# Patient Record
Sex: Male | Born: 1970 | Race: White | Hispanic: No | Marital: Married | Smoking: Former smoker
Health system: Southern US, Community
[De-identification: ages and names within clinical notes are randomized; demographics above are authoritative.]

## PROBLEM LIST (undated history)

## (undated) DIAGNOSIS — I10 Essential (primary) hypertension: Secondary | ICD-10-CM

## (undated) DIAGNOSIS — K219 Gastro-esophageal reflux disease without esophagitis: Secondary | ICD-10-CM

## (undated) DIAGNOSIS — F419 Anxiety disorder, unspecified: Secondary | ICD-10-CM

## (undated) DIAGNOSIS — R002 Palpitations: Secondary | ICD-10-CM

## (undated) DIAGNOSIS — E119 Type 2 diabetes mellitus without complications: Secondary | ICD-10-CM

## (undated) HISTORY — PX: NO PAST SURGERIES: SHX2092

## (undated) HISTORY — DX: Palpitations: R00.2

## (undated) HISTORY — DX: Anxiety disorder, unspecified: F41.9

## (undated) HISTORY — DX: Essential (primary) hypertension: I10

## (undated) HISTORY — DX: Type 2 diabetes mellitus without complications: E11.9

## (undated) HISTORY — DX: Gastro-esophageal reflux disease without esophagitis: K21.9

---

## 2005-08-04 ENCOUNTER — Ambulatory Visit: Payer: Self-pay | Admitting: Internal Medicine

## 2005-11-14 ENCOUNTER — Ambulatory Visit: Payer: Self-pay | Admitting: Internal Medicine

## 2005-11-23 ENCOUNTER — Ambulatory Visit: Payer: Self-pay | Admitting: Internal Medicine

## 2006-12-13 ENCOUNTER — Ambulatory Visit: Payer: Self-pay | Admitting: Internal Medicine

## 2006-12-13 LAB — CONVERTED CEMR LAB
Calcium: 9.4 mg/dL (ref 8.4–10.5)
HDL: 39.2 mg/dL (ref >39.0)
Potassium: 4.4 meq/L (ref 3.5–5.1)
Total CHOL/HDL Ratio: 4.3
Triglycerides: 99 mg/dL (ref 0–149)
VLDL: 20 mg/dL (ref 0–40)

## 2007-01-04 ENCOUNTER — Ambulatory Visit: Payer: Self-pay | Admitting: Internal Medicine

## 2007-01-04 DIAGNOSIS — I10 Essential (primary) hypertension: Secondary | ICD-10-CM | POA: Insufficient documentation

## 2007-01-04 LAB — CONVERTED CEMR LAB
ALT: 39 units/L (ref 0–40)
AST: 29 units/L (ref 0–37)
Alkaline Phosphatase: 63 units/L (ref 39–117)
BUN: 20 mg/dL (ref 6–23)
Basophils Relative: 0.3 % (ref 0.0–1.0)
Creatinine, Ser: 0.8 mg/dL (ref 0.4–1.5)
Eosinophils Relative: 1.4 % (ref 0.0–5.0)
Glucose, Bld: 92 mg/dL (ref 70–99)
MCHC: 34.4 g/dL (ref 30.0–36.0)
MCV: 86.5 fL (ref 78.0–100.0)
Monocytes Relative: 7.8 % (ref 3.0–11.0)
Platelets: 240 10*3/uL (ref 150–400)
Potassium: 4.4 meq/L (ref 3.5–5.1)
RBC: 4.08 M/uL — ABNORMAL LOW (ref 4.22–5.81)
RDW: 12.5 % (ref 11.5–14.6)
WBC: 9.7 10*3/uL (ref 4.5–10.5)

## 2007-01-17 ENCOUNTER — Ambulatory Visit: Payer: Self-pay | Admitting: Internal Medicine

## 2007-01-30 ENCOUNTER — Ambulatory Visit: Payer: Self-pay | Admitting: Internal Medicine

## 2007-01-30 ENCOUNTER — Encounter: Payer: Self-pay | Admitting: Internal Medicine

## 2007-01-30 ENCOUNTER — Encounter (INDEPENDENT_AMBULATORY_CARE_PROVIDER_SITE_OTHER): Payer: Self-pay | Admitting: Specialist

## 2007-01-30 LAB — CONVERTED CEMR LAB
Basophils Absolute: 0.3 10*3/uL — ABNORMAL HIGH (ref 0.0–0.1)
Eosinophils Absolute: 0.2 10*3/uL (ref 0.0–0.6)
Eosinophils Relative: 1.9 % (ref 0.0–5.0)
HCT: 38.2 % — ABNORMAL LOW (ref 39.0–52.0)
Hemoglobin: 12.8 g/dL — ABNORMAL LOW (ref 13.0–17.0)
Lymphocytes Relative: 22.9 % (ref 12.0–46.0)
MCV: 86.6 fL (ref 78.0–100.0)
Monocytes Absolute: 0.7 10*3/uL (ref 0.2–0.7)
Platelets: 236 10*3/uL (ref 150–400)
RBC: 4.41 M/uL (ref 4.22–5.81)

## 2007-04-23 ENCOUNTER — Ambulatory Visit: Payer: Self-pay | Admitting: Internal Medicine

## 2007-07-04 ENCOUNTER — Telehealth (INDEPENDENT_AMBULATORY_CARE_PROVIDER_SITE_OTHER): Payer: Self-pay | Admitting: *Deleted

## 2007-07-05 ENCOUNTER — Ambulatory Visit: Payer: Self-pay | Admitting: Internal Medicine

## 2007-07-05 DIAGNOSIS — F411 Generalized anxiety disorder: Secondary | ICD-10-CM | POA: Insufficient documentation

## 2007-07-11 ENCOUNTER — Ambulatory Visit: Payer: Self-pay | Admitting: Cardiology

## 2007-07-16 ENCOUNTER — Encounter: Payer: Self-pay | Admitting: Cardiology

## 2007-07-16 ENCOUNTER — Ambulatory Visit: Payer: Self-pay

## 2007-09-12 ENCOUNTER — Encounter (INDEPENDENT_AMBULATORY_CARE_PROVIDER_SITE_OTHER): Payer: Self-pay | Admitting: *Deleted

## 2007-09-12 ENCOUNTER — Ambulatory Visit: Payer: Self-pay | Admitting: Internal Medicine

## 2007-09-12 DIAGNOSIS — K219 Gastro-esophageal reflux disease without esophagitis: Secondary | ICD-10-CM | POA: Insufficient documentation

## 2007-09-16 ENCOUNTER — Telehealth (INDEPENDENT_AMBULATORY_CARE_PROVIDER_SITE_OTHER): Payer: Self-pay | Admitting: *Deleted

## 2007-09-17 ENCOUNTER — Ambulatory Visit: Payer: Self-pay | Admitting: Internal Medicine

## 2007-10-01 ENCOUNTER — Ambulatory Visit: Payer: Self-pay | Admitting: Internal Medicine

## 2008-11-13 ENCOUNTER — Telehealth (INDEPENDENT_AMBULATORY_CARE_PROVIDER_SITE_OTHER): Payer: Self-pay | Admitting: *Deleted

## 2008-11-25 ENCOUNTER — Encounter (INDEPENDENT_AMBULATORY_CARE_PROVIDER_SITE_OTHER): Payer: Self-pay | Admitting: *Deleted

## 2009-06-10 ENCOUNTER — Telehealth (INDEPENDENT_AMBULATORY_CARE_PROVIDER_SITE_OTHER): Payer: Self-pay | Admitting: *Deleted

## 2009-07-19 ENCOUNTER — Ambulatory Visit: Payer: Self-pay | Admitting: Internal Medicine

## 2009-07-19 ENCOUNTER — Encounter: Payer: Self-pay | Admitting: Internal Medicine

## 2009-07-19 DIAGNOSIS — L989 Disorder of the skin and subcutaneous tissue, unspecified: Secondary | ICD-10-CM | POA: Insufficient documentation

## 2009-07-21 ENCOUNTER — Ambulatory Visit: Payer: Self-pay | Admitting: Internal Medicine

## 2009-07-21 LAB — CONVERTED CEMR LAB
Basophils Absolute: 0 10*3/uL (ref 0.0–0.1)
Calcium: 8.8 mg/dL (ref 8.4–10.5)
Chloride: 106 meq/L (ref 96–112)
Eosinophils Absolute: 0.1 10*3/uL (ref 0.0–0.7)
GFR calc non Af Amer: 88.52 mL/min (ref >60)
Glucose, Bld: 123 mg/dL — ABNORMAL HIGH (ref 70–99)
HDL: 30.3 mg/dL — ABNORMAL LOW (ref >39.00)
Hemoglobin: 15.4 g/dL (ref 13.0–17.0)
Lymphocytes Relative: 24.1 % (ref 12.0–46.0)
MCHC: 33.3 g/dL (ref 30.0–36.0)
Monocytes Absolute: 0.7 10*3/uL (ref 0.1–1.0)
Neutro Abs: 5.3 10*3/uL (ref 1.4–7.7)
Potassium: 4.3 meq/L (ref 3.5–5.1)
RBC: 5.28 M/uL (ref 4.22–5.81)
RDW: 13.5 % (ref 11.5–14.6)
WBC: 8.1 10*3/uL (ref 4.5–10.5)

## 2009-07-28 ENCOUNTER — Telehealth (INDEPENDENT_AMBULATORY_CARE_PROVIDER_SITE_OTHER): Payer: Self-pay | Admitting: *Deleted

## 2009-07-28 DIAGNOSIS — E119 Type 2 diabetes mellitus without complications: Secondary | ICD-10-CM | POA: Insufficient documentation

## 2009-11-18 ENCOUNTER — Encounter: Admission: RE | Admit: 2009-11-18 | Discharge: 2009-11-18 | Payer: Self-pay

## 2010-06-07 ENCOUNTER — Telehealth (INDEPENDENT_AMBULATORY_CARE_PROVIDER_SITE_OTHER): Payer: Self-pay | Admitting: *Deleted

## 2010-07-28 ENCOUNTER — Ambulatory Visit: Payer: Self-pay | Admitting: Internal Medicine

## 2010-08-01 ENCOUNTER — Ambulatory Visit: Payer: Self-pay | Admitting: Internal Medicine

## 2010-08-03 LAB — CONVERTED CEMR LAB
ALT: 33 units/L (ref 0–53)
BUN: 27 mg/dL — ABNORMAL HIGH (ref 6–23)
Basophils Absolute: 0 10*3/uL (ref 0.0–0.1)
CO2: 27 meq/L (ref 19–32)
Chloride: 103 meq/L (ref 96–112)
Creatinine, Ser: 1 mg/dL (ref 0.4–1.5)
GFR calc non Af Amer: 87.04 mL/min (ref >60)
Glucose, Bld: 106 mg/dL — ABNORMAL HIGH (ref 70–99)
HDL: 35.5 mg/dL — ABNORMAL LOW (ref >39.00)
Lymphocytes Relative: 25.1 % (ref 12.0–46.0)
MCHC: 33.4 g/dL (ref 30.0–36.0)
MCV: 88.6 fL (ref 78.0–100.0)
Monocytes Absolute: 0.8 10*3/uL (ref 0.1–1.0)
Neutro Abs: 4.6 10*3/uL (ref 1.4–7.7)
Platelets: 209 10*3/uL (ref 150.0–400.0)
RDW: 14 % (ref 11.5–14.6)
TSH: 0.73 microintl units/mL (ref 0.35–5.50)
VLDL: 8.6 mg/dL (ref 0.0–40.0)

## 2010-08-23 ENCOUNTER — Ambulatory Visit: Payer: Self-pay | Admitting: Internal Medicine

## 2010-08-23 DIAGNOSIS — G5600 Carpal tunnel syndrome, unspecified upper limb: Secondary | ICD-10-CM | POA: Insufficient documentation

## 2010-09-26 ENCOUNTER — Telehealth (INDEPENDENT_AMBULATORY_CARE_PROVIDER_SITE_OTHER): Payer: Self-pay | Admitting: *Deleted

## 2010-11-27 LAB — CONVERTED CEMR LAB
CO2: 31 meq/L (ref 19–32)
Creatinine, Ser: 1 mg/dL (ref 0.4–1.5)
Eosinophils Absolute: 0.1 10*3/uL (ref 0.0–0.6)
Eosinophils Relative: 1.6 % (ref 0.0–5.0)
Glucose, Bld: 96 mg/dL (ref 70–99)
Hemoglobin: 14.6 g/dL (ref 13.0–17.0)
Lymphocytes Relative: 25.4 % (ref 12.0–46.0)
MCHC: 33.2 g/dL (ref 30.0–36.0)
Monocytes Relative: 8.4 % (ref 3.0–11.0)
Neutrophils Relative %: 64.4 % (ref 43.0–77.0)
Potassium: 4.4 meq/L (ref 3.5–5.1)
RBC: 5.38 M/uL (ref 4.22–5.81)
Sodium: 136 meq/L (ref 135–145)
WBC: 8.4 10*3/uL (ref 4.5–10.5)

## 2010-11-30 ENCOUNTER — Ambulatory Visit: Payer: Self-pay | Admitting: Internal Medicine

## 2010-11-30 ENCOUNTER — Ambulatory Visit: Admit: 2010-11-30 | Payer: Self-pay | Admitting: Internal Medicine

## 2010-12-01 NOTE — Assessment & Plan Note (Signed)
Summary: cpx & lab/cbs   Vital Signs:  Patient profile:   40 year old male Height:      70 inches Weight:      263 pounds BMI:     37.87 Pulse rate:   76 / minute Pulse rhythm:   regular BP sitting:   124 / 82  (left arm) Cuff size:   large  Vitals Entered By: Army Fossa CMA (July 28, 2010 3:04 PM) CC: CPX, not fasting Comments declines flu shot [facility] [address]    History of Present Illness: CPX   Preventive Screening-Counseling & Management  Alcohol-Tobacco     Smoking Status: current  Caffeine-Diet-Exercise     Does Patient Exercise: yes     Times/week: 3  Current Medications (verified): 1)  Atenolol 100 Mg  Tabs (Atenolol) .Marland Kitchen.. 1 By Mouth Two Times A Day 2)  Nexium 40 Mg Cpdr (Esomeprazole Magnesium) .... Take 1 Capsule By Mouth Once A Day  Allergies (verified): No Known Drug Allergies  Past History:  Past Medical History: Reviewed history from 07/19/2009 and no changes required. Hypertension Anxiety GERD Palpaitations 07-2007: saw cards, ECHO normal  DUODENITIS W/HEMORRHAGE -- EGD was positive,H. pylori positive, status post treatment.  Family History: coronary artery disease--no  prostate cancer--no colon cancer--no son has IHSS, resolving as he is growing  diabetes-- uncle   grandmother has CHF. 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 CV:  Denies chest pain or discomfort and swelling of feet. Resp:  Denies cough and shortness of breath. GI:  Denies bloody stools, nausea, and vomiting. GU:  Denies dysuria, urinary frequency, and urinary hesitancy. MS:  knee pain, R>L . Psych:  admits to  anxiety related to  work load, also separated from his wife.  Physical Exam  General:  alert, well-developed, and overweight-appearing.   Neck:  no masses and no thyromegaly.   Lungs:  normal respiratory effort, no intercostal retractions, no accessory muscle use, and normal breath sounds.   Heart:  normal rate, regular rhythm, no murmur, and no gallop.   Abdomen:  soft, non-tender, no distention, no masses, no guarding, and no rigidity.   Extremities:  no edema   Impression & Recommendations:  Problem # 1:  HEALTH SCREENING (ICD-V70.0) Td 2008 reluctant to take the flu shot, benefits discussed, will call if he changes  his mind labs diet- exercise -- discussed   Problem # 2:  DM (ICD-250.00) diabetes diagnosed last year based on A1c of 6.7 We discussed the meaning of A1c We'll recheck it Depending on the results, he may need medication. Come back in 4 months refer to a nutritionist  Orders: Nutrition Referral (Nutrition)  Problem # 3:  ANXIETY (ICD-300.00) counseled Does not feel that he needs medication or psychotherapy but will call if the need arise  Problem # 4:  HYPERTENSION (ICD-401.9) self decreased atenolol to 1 once daily b/c he was fatigue, doing well now, good ambulatory BPs  plan: stay on 1 once daily  His updated medication list for this problem includes:    Atenolol 100 Mg Tabs (Atenolol) .Marland Kitchen... 1 by mouth once daily  BP today: 124/82 Prior BP: 120/80 (07/19/2009)  Labs  Reviewed: K+: 4.3 (07/21/2009) Creat: : 1.0 (07/21/2009)   Chol: 169 (07/21/2009)   HDL: 30.30 (07/21/2009)   LDL: 116 (07/21/2009)   TG: 116.0 (07/21/2009)  Complete Medication List: 1)  Atenolol 100 Mg Tabs (Atenolol) .Marland Kitchen.. 1 by mouth once daily 2)  Nexium 40 Mg Cpdr (Esomeprazole magnesium) .... Take 1 capsule by mouth once a day  Patient Instructions: 1)  come back fasting 2)  FLP, BMP, CBC, TSH, AST, ALT--- dx v70 3)  Hemoglobin A1c---dx diabetes 4)  Please schedule a follow-up appointment in 4  months .    Risk Factors:  Tobacco use:  current    Cigars:  Yes -- 1-2 per week Exercise:  yes    Times per week:  3

## 2010-12-01 NOTE — Progress Notes (Signed)
Summary: Refill Request  Phone Note Refill Request Message from:  Patient on September 26, 2010 1:06 PM  Refills Requested: Medication #1:  NEXIUM 40 MG CPDR Take 1 capsule by mouth once a day.   Dosage confirmed as above?Dosage Confirmed   Supply Requested: 3 months   [facility] [address] [facility] [place]  Next Appointment Scheduled: 2.1.12 Initial call taken by: Harold Barban,  September 26, 2010 1:06 PM    Prescriptions: NEXIUM 40 MG CPDR (ESOMEPRAZOLE MAGNESIUM) Take 1 capsule by mouth once a day  #30 Each x 3   Entered by:   Army Fossa CMA   Authorized by:   Nolon Rod. Paz MD   Signed by:   Army Fossa CMA on 09/26/2010   Method used:   [facility]ronically to        [id number]       [address]       [place], [place]  [address]       Ph: [phone]       Fax: [phone]   RxID:   [id number]

## 2010-12-01 NOTE — Progress Notes (Signed)
Summary: cpx scheduled [phone]  Phone Note Call from Patient Call back at Home Phone [phone]   Caller: Spouse Summary of Call: NEEDS REFILL FOR NEXIUM CALLED INTO  [facility] [facility] Initial call taken by: Jerolyn Shin,  June 07, 2010 9:37 AM  Follow-up for Phone Call        Pt has not been seen since 07/21/09. okay to fill? Army Fossa CMA  June 07, 2010 10:11 AM okay to rf, he will be due for her next visit 06-2010 [facility] E. Paz MD  June 07, 2010 3:48 PM   Additional Follow-up for Phone Call Additional follow up Details #1::        Needs to schedule appt for 06/2010. Army Fossa CMA  June 07, 2010 3:50 PM cpx scheduled [id number] .Marland KitchenOkey Regal [place]  June 08, 2010 11:31 AM     Prescriptions: NEXIUM 40 MG CPDR (ESOMEPRAZOLE MAGNESIUM) Take 1 capsule by mouth once a day  #30 x 0   Entered by:   Army Fossa CMA   Authorized by:   Nolon Rod. Paz MD   Signed by:   Army Fossa CMA on 06/07/2010   Method used:   Electronically to        [facility]  [phone]* (retail)       [address]       [place], [place]  [address]       Ph: [phone]       Fax: [phone]   RxID:   [id number]

## 2010-12-01 NOTE — Assessment & Plan Note (Signed)
Summary: INFLAMATION ON HIS HAND   Vital Signs:  Patient profile:   40 year old male Weight:      257 pounds Pulse rate:   60 / minute Pulse rhythm:   regular BP sitting:   134 / 84  (left arm) Cuff size:   large  Vitals Entered By: Army Fossa CMA (August 23, 2010 11:46 AM) CC: Pt here c/o hands going numb, unable to make a fist in the am. Comments going on for 3-4 years, getting worse overtime pharm[facility] [address]    History of Present Illness: long history of on and off tingling in the hands, right worse than left,  usually  when he rides a motorcycle or when he does a lot of remodeling and  uses  his hands. In the last few months, he has been very active and he has severe pain and tingling R>L hand , unable to use his right hand sometimes  Review of systems Mild neck pain without radiation, no rash anywhere in the neck or arm.   Current Medications (verified): 1)  Atenolol 100 Mg  Tabs (Atenolol) .Marland Kitchen.. 1 By Mouth Once Daily 2)  Nexium 40 Mg Cpdr (Esomeprazole Magnesium) .... Take 1 Capsule By Mouth Once A Day  Allergies (verified): No Known Drug Allergies  Past History:  Past Medical History: AODM Hypertension Anxiety GERD Palpaitations 07-2007: saw cards, ECHO normal  DUODENITIS W/HEMORRHAGE -- EGD was positive,H. pylori positive, status post treatment.  Past Surgical History: Reviewed history from 07/19/2009 and no changes required. Denies surgical history  Social History: Reviewed history from 07/28/2010 and no changes required. 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   Neck:  full range of motion, nontender to palpation Extremities:  right wrist slightly tender to touch, putting pressure on the right wrist cause some  tingling in the first second and third finger. Left wrist essentially within normal Fingers without redness or swelling.   Impression & Recommendations:  Problem # 1:  CARPAL TUNNEL SYNDROME, BILATERAL (ICD-354.0)  history consistent with carpal tunnel syndrome, right worse than left We discussed conservative therapy including a splint which she already has and use from time to time.  At this point he needs a more permanent solution, will refer to orthopedic surgery  Orders: Orthopedic Referral (Ortho)  Complete Medication List: 1)  Atenolol 100 Mg Tabs (Atenolol) .Marland Kitchen.. 1 by mouth once daily 2)  Nexium 40 Mg Cpdr (Esomeprazole magnesium) .... Take 1 capsule by mouth once a day   Orders Added: 1)  Orthopedic Referral [Ortho] 2)  Est. Patient Level III [[address]]

## 2010-12-07 ENCOUNTER — Telehealth (INDEPENDENT_AMBULATORY_CARE_PROVIDER_SITE_OTHER): Payer: Self-pay | Admitting: *Deleted

## 2010-12-15 NOTE — Progress Notes (Signed)
Summary: rx  Phone Note Refill Request Call back at Home Phone [phone] Message from:  Patient on December 07, 2010 9:37 AM  Refills Requested: Medication #1:  ATENOLOL 100 MG  TABS 1 by mouth once daily   Dosage confirmed as above?Dosage Confirmed   [facility]ed[facility] [address]  Initial call taken by: Freddy Jaksch,  December 07, 2010 9:37 AM    New/Updated Medications: ATENOLOL 100 MG  TABS (ATENOLOL) 1 by mouth once daily. DUE FOR OFFICE VISIT. Prescriptions: ATENOLOL 100 MG  TABS (ATENOLOL) 1 by mouth once daily. DUE FOR OFFICE VISIT.  #30 x 0   Entered by:   Army Fossa CMA   Authorized by:   Nolon Rod. Paz MD   Signed by:   Army Fossa CMA on 12/07/2010   Method used:   [facility]ronically to        [id number]       [address]       [place], [place]  [address]       Ph: [phone]       Fax: [phone]   RxID:   [id number]

## 2011-02-07 ENCOUNTER — Other Ambulatory Visit: Payer: Self-pay | Admitting: Internal Medicine

## 2011-03-10 ENCOUNTER — Other Ambulatory Visit: Payer: Self-pay | Admitting: Internal Medicine

## 2011-03-14 NOTE — Assessment & Plan Note (Signed)
[facility]                            CARDIOLOGY OFFICE NOTE   JAIMIE, REDDITT                       MRN:          [id number]  DATE:07/11/2007                            DOB:          1971/09/09    HISTORY:  Mr. Sackrider is a 40 year old gentleman with no prior cardiac  history who we are asked to evaluate for palpitations.   The patient typically does not have dyspnea on exertion, orthopnea,  paroxysmal nocturnal dyspnea, pedal edema, presyncope, syncope or  exertional chest pain.  He does state that he felt sore after lifting  weights, in his chest.  Approximately two years ago he developed  palpitations.  They are described as a hard beat.  They are not  sustained palpitations nor is there associated presyncope, syncope,  chest pain or shortness of breath.  He decreased his caffeine intake and  these improved.  However, recently, he has been diagnosed with H. Pylori  and treated.  He came off the Nexium and he feels that his symptoms now  have returned similar to the description above.  Also, he is under a  significant amount of stress at work by his report with increased  responsibilities.  Because of his palpitations, we were asked to further  evaluate.   MEDICATIONS:  His medications include:  1. Atenolol 150 mg p.o. daily.  2. Nexium 40 mg p.o. daily.   ALLERGIES:  He has no known drug allergies.   SOCIAL HISTORY:  He smokes approximately 1 cigar per month.  He consumes  approximately 5 beers per month.  There is no drug use.  He does consume  several caffeinated beverages per day.   FAMILY HISTORY:  Significant for hypertrophic obstructive cardiomyopathy  in his son.  He apparently has been evaluated for this and a previous  echocardiogram when his son was a child was unremarkable.   PAST MEDICAL HISTORY:  Significant for hypertension but there is no  diabetes mellitus or hyperlipidemia.  He has had previous fractures of  both of  his hands while boxing. He has also had a previous fractured  clavicle.  He has a recent H. Pylori infection and does have reflux.   REVIEW OF SYSTEMS:  There are no headaches, fevers or chills.  There is  no productive cough or hemoptysis.  There is no dysphagia, odynophagia,  melena, hematochezia.  There is no dysuria or hematuria. There is no  rash or seizure activity.  There is no orthopnea, PND, pedal edema.  There is no claudication.  The remainder of his review of systems are  negative.   PHYSICAL EXAMINATION:  VITAL SIGNS:  His physical examination today  shows a blood pressure of 152/97. His pulse is 76. He weighs 259 pounds.  GENERAL APPEARANCE:  He is well-developed and somewhat obese.  He is in  no acute distress.  He does not appear to be depressed.  SKIN:  His skin is warm and dry.  There is no peripheral clubbing.  MUSCULOSKELETAL:  His back  is normal.  HEENT:  Normal with normal eyelids.  NECK:  Supple with normal upstrokes bilaterally.  No bruits.  There is  no jugular venous distention and no thyromegaly is noted.  CHEST:  Clear to auscultation with normal expansion.  CARDIOVASCULAR:  His cardiovascular exam reveals a regular rate and  rhythm with normal S1 and S2.  There are no murmurs, rubs, gallops  noted.  His PMI is nondisplaced.  ABDOMEN:  No tenderness.  Positive bowel sounds.  No hepatosplenomegaly.  No masses appreciated.  There is no abdominal bruit.  He has 2+ femoral  pulses bilaterally.  No bruits.  EXTREMITIES:  No edema and I could palpate no cords.  He has 2+  posterior tibial pulses bilaterally.  NEUROLOGICAL:  His exam is grossly intact.   LABORATORY DATA:  An electrocardiogram today shows a sinus rhythm at a  rate of 79.  The axis is normal.  There are no significant ST changes.   DIAGNOSES:  1. Palpitations.  Mr. Carpenito description of his symptoms are most      consistent with premature atrial or ventricular contractions.  We      will  schedule him to have an echocardiogram to quantify his left      ventricular function.  I have also asked him to increase his      atenolol and we will change to 100 mg p.o. b.i.d.  I have also      instructed him to decrease his caffeine use.  I will see him back      in 8 weeks and if he continues to have symptoms we could consider      an event monitor to more fully evaluate.  2. Hypertension.  His blood pressure is elevated today and I have      asked him to change his Atenolol to 100 mg p.o. b.i.d.  We will      track this and add additional medicines if indicated.  3. Gastroesophageal reflux disease.  He will continue on his Nexium.     Madolyn Frieze Jens Som, MD, [facility]  Electronically Signed    BSC/MedQ  DD: 07/11/2007  DT: 07/12/2007  Job #: [id number]   cc:   Willow Ora, MD

## 2011-03-17 NOTE — Assessment & Plan Note (Signed)
[facility]                         GASTROENTEROLOGY OFFICE NOTE   Gabriel Patel, Gabriel Patel                       MRN:          [id number]  DATE:01/17/2007                            DOB:          Apr 30, 1971    REFERRING PHYSICIAN:  Willow Ora, MD   CHIEF COMPLAINT:  Melena.   ASSESSMENT:  A 40 year old white man who had several days of melena  earlier in the month.  He had a Hemoccult positive brown stool on Dr.  Leta Jungling exam of January 04, 2007.  Subsequently, he has been on Nexium and  this has resolved.  There are no obvious risk factors for his melena,  which included review for NSAID.  No Pepto-Bismol.  He was taking a Fat-  Burner supplement, but I do not think that would be related.  There was  some bloating along with that week of black tarry stools.   RECOMMENDATIONS AND PLAN:  1. Schedule upper GI endoscopy.  2. I think he can reduce his Nexium to a once a day dose at this time.  3. He was mildly anemic with a hemoglobin of 12.  We will recheck a      CBC.  4. If he has an unrevealing upper endoscopy, I think he is going to      need a colonoscopy to exclude bleeding from that area, versus      watchful waiting, but we have discussed this and it sounds like he      is in agreement of a plan of colonoscopy if the upper endoscopy is      unrevealing.   HISTORY:  As above.  See medical history form.   PAST MEDICAL HISTORY:  Hypertension.   MEDICATIONS:  1. Atenolol 150 mg daily.  2. Nexium 40 mg b.i.d.   There are no known drug allergies.   FAMILY HISTORY:  A grandmother had colon cancer.   SOCIAL HISTORY:  He manages a Agricultural consultant.  He is married.  He has 1 son.  He has a Naval architect.  Social alcohol use.  No  tobacco or drugs.  When he drinks too much coffee he gets palpitations  apparently.   Other review of systems negative.   PHYSICAL EXAM:  Reveals a well-developed, well-nourished young white  man.  Height 5  feet 11 inches, weight 239 pounds, blood pressure 118/82, pulse  64 and regular.  EYES:  Anicteric.  MOUTH:  Free of lesions.  NECK:  Supple.  CHEST:  Clear.  HEART:  S1, S2.  No rubs, murmurs, or gallops.  ABDOMEN:  Soft.  Nontender.  Without organomegaly or mass.  LOWER EXTREMITIES:  Free of edema.  He is alert and nontoxic.   I appreciate the opportunity to care for this patient.  I have reviewed  the labs and office notes from Dr. Drue Novel.     Iva Boop, MD,FACG  Electronically Signed    CEG/MedQ  DD: 01/18/2007  DT: 01/18/2007  Job #: [id number]

## 2011-04-11 ENCOUNTER — Other Ambulatory Visit: Payer: Self-pay | Admitting: Internal Medicine

## 2011-04-11 NOTE — Telephone Encounter (Signed)
Pt is due for an appt- please schedule.

## 2011-04-11 NOTE — Telephone Encounter (Signed)
Called patient to make appointment---he says he cant make appointment until he gets more insurance--advised him that Atenalol presc was called in for 30 days--he asked Dr Drue Novel to "understand" until he can get back on insurance

## 2011-05-10 ENCOUNTER — Other Ambulatory Visit: Payer: Self-pay | Admitting: Internal Medicine

## 2011-06-08 ENCOUNTER — Other Ambulatory Visit: Payer: Self-pay | Admitting: Internal Medicine

## 2011-07-13 ENCOUNTER — Other Ambulatory Visit: Payer: Self-pay | Admitting: Internal Medicine

## 2011-07-13 NOTE — Telephone Encounter (Signed)
Rx Done for 1-mth supply; OV required for further refills.

## 2011-08-10 ENCOUNTER — Telehealth: Payer: Self-pay | Admitting: Internal Medicine

## 2011-08-10 NOTE — Telephone Encounter (Signed)
Print a copy of his complete physical exam from 07/28/2010 that is essentially a statement of  his medical condition. Marland Kitchen He not only has hypertension but also diabetes. he is actually overdue for a routine visit !

## 2011-08-11 ENCOUNTER — Telehealth: Payer: Self-pay | Admitting: Internal Medicine

## 2011-08-11 NOTE — Telephone Encounter (Signed)
Patient Summary & last CPE [06/2010] printed from [facility]. Faxed to patient per his request to [phone].

## 2011-08-15 NOTE — Telephone Encounter (Signed)
error 

## 2011-08-21 ENCOUNTER — Other Ambulatory Visit: Payer: Self-pay | Admitting: Internal Medicine

## 2011-08-21 MED ORDER — ATENOLOL 100 MG PO TABS: 100.0000 mg | ORAL_TABLET | Freq: Every day | ORAL | Status: DC

## 2011-08-21 NOTE — Telephone Encounter (Signed)
Done

## 2011-08-31 ENCOUNTER — Encounter: Payer: Self-pay | Admitting: Internal Medicine

## 2011-09-01 ENCOUNTER — Ambulatory Visit (INDEPENDENT_AMBULATORY_CARE_PROVIDER_SITE_OTHER): Payer: PRIVATE HEALTH INSURANCE | Admitting: Internal Medicine

## 2011-09-01 ENCOUNTER — Encounter: Payer: Self-pay | Admitting: Internal Medicine

## 2011-09-01 DIAGNOSIS — I10 Essential (primary) hypertension: Secondary | ICD-10-CM

## 2011-09-01 DIAGNOSIS — E119 Type 2 diabetes mellitus without complications: Secondary | ICD-10-CM

## 2011-09-01 LAB — BASIC METABOLIC PANEL
BUN: 18 mg/dL (ref 6–23)
Glucose, Bld: 107 mg/dL — ABNORMAL HIGH (ref 70–99)
Potassium: 4.3 mEq/L (ref 3.5–5.3)
Sodium: 137 mEq/L (ref 135–145)

## 2011-09-01 MED ORDER — METOPROLOL TARTRATE 50 MG PO TABS: 50.0000 mg | ORAL_TABLET | Freq: Two times a day (BID) | ORAL | Status: DC

## 2011-09-01 MED ORDER — LOSARTAN POTASSIUM 50 MG PO TABS: 50.0000 mg | ORAL_TABLET | Freq: Two times a day (BID) | ORAL | Status: DC

## 2011-09-01 NOTE — Assessment & Plan Note (Signed)
Not well control w/100mg  of atenolol At some point took 200 mg but he was tired  In the past was on diuretics but he seems to recall they didn't work too well Plan: Change to metoprolol, add losartan, see instructions, labs

## 2011-09-01 NOTE — Progress Notes (Signed)
  Subjective:    Patient ID: Gabriel Patel, male    DOB: [birth year], 40 y.o.   MRN: [id number]  HPI Last visit ~ 1 year ago Was taking metoprolol qd until a day ago when he ran out; while on BBs , his BP was still elevated , sometimes in the 160/100 range Unfortunately went w/o insurance until today so he was unable to RTC sooner   Past Medical History  Diagnosis Date  . Hypertension   . Anxiety   . GERD (gastroesophageal reflux disease)   . Palpitations   . Duodenitis     w/HEMORRHAGE   No past surgical history on file.  History   Social History  . Marital Status: Married    Spouse Name: N/A    Number of Children: 1  . Years of Education: N/A   Occupational History  . 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 Smoking status: Former Games developer  . Smokeless tobacco: Never Used   Comment:    . Alcohol Use: Yes     SOMETIMES  . Drug Use: No  . Sexually Active: Not on file   Other Topics Concern  . Not on file   Social History Narrative  . No narrative on file    Review of Systems No CP-SOB Not eating healthy, not exercising     Objective:   Physical Exam  Constitutional: He is oriented to person, place, and time. He appears well-developed. No distress.  HENT:  Head: Normocephalic and atraumatic.  Cardiovascular: Normal rate, regular rhythm and normal heart sounds.   No murmur heard. Pulmonary/Chest: Effort normal and breath sounds normal. No respiratory distress. He has no wheezes. He has no rales.  Musculoskeletal: He exhibits no edema.  Neurological: He is alert and oriented to person, place, and time.  Skin: He is not diaphoretic.  Psychiatric: He has a normal mood and affect. His behavior is normal. Judgment and thought content normal.       Assessment & Plan:

## 2011-09-01 NOTE — Patient Instructions (Signed)
Check the  blood pressure 2 times a day, be sure it is between 110/70-140/85 If it is too low, take only one losartan Call if problems Call my nurse Jovonna next week, arrange for her to check your BP here (no charge)

## 2011-09-01 NOTE — Assessment & Plan Note (Signed)
Pt was surprised he has DM ;again I told him he DOES have DM Labs

## 2011-09-05 ENCOUNTER — Telehealth: Payer: Self-pay

## 2011-09-05 NOTE — Telephone Encounter (Signed)
Message copied by Francisco Capuchin on Tue Sep 05, 2011  5:09 PM ------      Message from: Willow Ora E      Created: Tue Sep 05, 2011  1:28 PM       Advise patient:      His diabetes test, hemoglobin A1c, is now 7.2 point, he needs medication. I recommend metformin 500 mg twice a day. Called in a prescription #60, 3 RF, followup is as planned  next month

## 2011-09-05 NOTE — Telephone Encounter (Signed)
Message left for call to be returned letter mailed to the pt home

## 2011-09-06 ENCOUNTER — Telehealth: Payer: Self-pay

## 2011-09-06 MED ORDER — METFORMIN HCL 500 MG PO TABS: 500.0000 mg | ORAL_TABLET | Freq: Two times a day (BID) | ORAL | Status: DC

## 2011-09-06 NOTE — Telephone Encounter (Signed)
Advise patient: His diabetes test, hemoglobin A1c, is now 7.2 point, he needs medication. I recommend metformin 500 mg twice a day. Called in a prescription #60, 3 RF, followup is as planned next month  Pt advised of above message and will call back with the name of pharmacy to send Rx because he is currently out of town. Pt also states that he is taking 2 BP meds bid and was told by MD to drop of the losartan if his BP's become too low. Pt takes his BP 2-3 x qd and notes that his diastolic has still been in the 90's to 100, so what he did was to increase the losartan this morning and will re-check BP when he gets home tonight. He will get metformin from pharmacy tonight and begin taking it   Rx sent to [facility]

## 2011-09-26 ENCOUNTER — Other Ambulatory Visit: Payer: Self-pay | Admitting: Internal Medicine

## 2011-09-28 NOTE — Telephone Encounter (Signed)
Left message for pt to call back. Atenolol was d/c by MD at last OV

## 2011-10-02 ENCOUNTER — Ambulatory Visit: Payer: PRIVATE HEALTH INSURANCE | Admitting: Internal Medicine

## 2011-10-02 MED ORDER — ATENOLOL 100 MG PO TABS: 100.0000 mg | ORAL_TABLET | Freq: Two times a day (BID) | ORAL | Status: DC

## 2011-10-02 NOTE — Telephone Encounter (Signed)
Pt states that metoprolol and losartan were not working. He says that his dystolic was running well over 100 so he d/c the metoprolol and losartan and started back taking the atenolol, but he is taking it bid. Per pt, his BP is back to normal and he would like a refill of the 100mg  atenolol  Ok to fill for #60 no RF, per Dr. Drue Novel. Pt does have a pending appointment to discuss with MD

## 2011-10-10 ENCOUNTER — Ambulatory Visit (INDEPENDENT_AMBULATORY_CARE_PROVIDER_SITE_OTHER): Payer: PRIVATE HEALTH INSURANCE | Admitting: Internal Medicine

## 2011-10-10 VITALS — BP 130/80 | HR 68 | Temp 97.5°F | Ht 72.0 in | Wt 273.0 lb

## 2011-10-10 DIAGNOSIS — E119 Type 2 diabetes mellitus without complications: Secondary | ICD-10-CM

## 2011-10-10 DIAGNOSIS — Z23 Encounter for immunization: Secondary | ICD-10-CM

## 2011-10-10 MED ORDER — ONETOUCH ULTRASOFT LANCETS MISC: Status: AC

## 2011-10-10 MED ORDER — METFORMIN HCL 850 MG PO TABS: 850.0000 mg | ORAL_TABLET | Freq: Two times a day (BID) | ORAL | Status: DC

## 2011-10-10 MED ORDER — GLUCOSE BLOOD VI STRP: ORAL_STRIP | Status: AC

## 2011-10-10 NOTE — Progress Notes (Signed)
  Subjective:    Patient ID: Gabriel Patel, male    DOB: 1971-05-28, 40 y.o.   MRN: [id number]  HPI Routine office visit Flu shot? Wondered if it is okay to take it despite the fact that at some point he had an  Allergic reaction  to latex  Past Medical History: AODM Hypertension Anxiety GERD Palpaitations 07-2007: saw cards, ECHO normal  DUODENITIS W/HEMORRHAGE -- EGD was positive,H. pylori positive, status post treatment.  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 BP now is a within normal. He feels slightly tired but is willing to continue with Tenormin. He started Glucophage, initially had some nausea, now asymptomatic. Is not taking his blood sugars. Denies any headache, nausea, vomiting, diarrhea     Objective:   Physical Exam  Constitutional: He is oriented to person, place, and time. He appears well-developed.       overweight  Cardiovascular: Normal rate, regular rhythm and normal heart sounds.   Pulmonary/Chest: Effort normal and breath sounds normal. No respiratory distress. He has no wheezes. He has no rales.  Musculoskeletal: He exhibits no edema.  Neurological: He is alert and oriented to person, place, and time.          Assessment & Plan:  Flu shot today

## 2011-10-10 NOTE — Patient Instructions (Signed)
In 6 weeks, came back fasting: FLP, A1C, AST ALT---dx DM Next visit 4 months

## 2011-10-10 NOTE — Assessment & Plan Note (Addendum)
A1Cs discussed, ambulatory CBGs goals discussed as well. Glucometer provided . Increase metformin dose to 850 bid a1c goal close to 6

## 2011-10-10 NOTE — Assessment & Plan Note (Signed)
Losartan-metoprolol did not work BP now better on atenolol 100 bid, feels a little tired but willing to take that Diet-exercise discussed

## 2011-11-01 ENCOUNTER — Other Ambulatory Visit: Payer: Self-pay | Admitting: Internal Medicine

## 2011-11-17 ENCOUNTER — Other Ambulatory Visit: Payer: Self-pay | Admitting: Internal Medicine

## 2011-11-17 DIAGNOSIS — E119 Type 2 diabetes mellitus without complications: Secondary | ICD-10-CM

## 2011-11-20 ENCOUNTER — Other Ambulatory Visit (INDEPENDENT_AMBULATORY_CARE_PROVIDER_SITE_OTHER): Payer: PRIVATE HEALTH INSURANCE

## 2011-11-20 DIAGNOSIS — E119 Type 2 diabetes mellitus without complications: Secondary | ICD-10-CM

## 2011-11-20 LAB — HEMOGLOBIN A1C: Hgb A1c MFr Bld: 7.3 % — ABNORMAL HIGH (ref 4.6–6.5)

## 2011-11-20 LAB — LIPID PANEL
HDL: 38.1 mg/dL — ABNORMAL LOW (ref >39.00)
Triglycerides: 118 mg/dL (ref 0.0–149.0)

## 2011-11-20 LAB — AST: AST: 27 U/L (ref 0–37)

## 2011-11-20 LAB — ALT: ALT: 49 U/L (ref 0–53)

## 2011-11-23 ENCOUNTER — Telehealth: Payer: Self-pay | Admitting: Internal Medicine

## 2011-11-23 NOTE — Telephone Encounter (Signed)
Advise patient: 1. Needs better diabetes control, increase metformin from BID  to 1 po TID . Call in a prescription. 2. Cholesterol needs improvement, start Lipitor 10 mg one by mouth each bedtime, #30 and 6 refills. 3. Followup by April as recommended

## 2011-11-24 MED ORDER — ATORVASTATIN CALCIUM 10 MG PO TABS: 10.0000 mg | ORAL_TABLET | Freq: Every day | ORAL | Status: DC

## 2011-11-24 NOTE — Telephone Encounter (Signed)
Phoned pt with med changes & sent in prescription.

## 2011-12-07 ENCOUNTER — Other Ambulatory Visit: Payer: Self-pay | Admitting: Internal Medicine

## 2011-12-08 NOTE — Telephone Encounter (Signed)
Refill done.  

## 2011-12-20 ENCOUNTER — Telehealth: Payer: Self-pay | Admitting: *Deleted

## 2011-12-20 ENCOUNTER — Other Ambulatory Visit: Payer: Self-pay | Admitting: *Deleted

## 2011-12-20 MED ORDER — METFORMIN HCL 850 MG PO TABS: 850.0000 mg | ORAL_TABLET | Freq: Three times a day (TID) | ORAL | Status: DC

## 2011-12-20 NOTE — Telephone Encounter (Signed)
Refill done.  

## 2012-03-05 ENCOUNTER — Telehealth: Payer: Self-pay | Admitting: Internal Medicine

## 2012-03-05 NOTE — Telephone Encounter (Signed)
Advice patient, he is overdue for a visit, please reschedule

## 2012-03-06 NOTE — Telephone Encounter (Signed)
Spoke w/pt. He states he is in a conference call and would like a call back in an hour (4 o'clock).

## 2012-04-03 ENCOUNTER — Other Ambulatory Visit (INDEPENDENT_AMBULATORY_CARE_PROVIDER_SITE_OTHER): Payer: PRIVATE HEALTH INSURANCE

## 2012-04-03 DIAGNOSIS — E7800 Pure hypercholesterolemia, unspecified: Secondary | ICD-10-CM

## 2012-04-03 DIAGNOSIS — E119 Type 2 diabetes mellitus without complications: Secondary | ICD-10-CM

## 2012-04-03 LAB — CBC WITH DIFFERENTIAL/PLATELET
Basophils Absolute: 0 10*3/uL (ref 0.0–0.1)
Eosinophils Absolute: 0.1 10*3/uL (ref 0.0–0.7)
HCT: 46 % (ref 39.0–52.0)
Lymphs Abs: 2.3 10*3/uL (ref 0.7–4.0)
MCV: 89.9 fl (ref 78.0–100.0)
Monocytes Absolute: 0.7 10*3/uL (ref 0.1–1.0)
Neutrophils Relative %: 65.4 % (ref 43.0–77.0)
Platelets: 193 10*3/uL (ref 150.0–400.0)
RDW: 13.9 % (ref 11.5–14.6)

## 2012-04-03 LAB — LIPID PANEL
Cholesterol: 121 mg/dL (ref 0–200)
HDL: 37.1 mg/dL — ABNORMAL LOW (ref >39.00)
LDL Cholesterol: 65 mg/dL (ref 0–99)
VLDL: 18.8 mg/dL (ref 0.0–40.0)

## 2012-04-03 LAB — BASIC METABOLIC PANEL
BUN: 16 mg/dL (ref 6–23)
Chloride: 104 mEq/L (ref 96–112)
GFR: 103.92 mL/min (ref >60.00)
Glucose, Bld: 128 mg/dL — ABNORMAL HIGH (ref 70–99)
Potassium: 4.4 mEq/L (ref 3.5–5.1)
Sodium: 140 mEq/L (ref 135–145)

## 2012-04-04 ENCOUNTER — Encounter: Payer: Self-pay | Admitting: *Deleted

## 2012-05-14 ENCOUNTER — Other Ambulatory Visit: Payer: Self-pay | Admitting: Internal Medicine

## 2012-06-11 ENCOUNTER — Other Ambulatory Visit: Payer: Self-pay | Admitting: Internal Medicine

## 2012-06-11 MED ORDER — ATORVASTATIN CALCIUM 10 MG PO TABS: 10.0000 mg | ORAL_TABLET | Freq: Every day | ORAL | Status: DC

## 2012-06-11 NOTE — Telephone Encounter (Signed)
Per lab note pt needs ov to discuss labs with dr paz----make appointment then refill med for 1 month only

## 2012-06-11 NOTE — Telephone Encounter (Signed)
appt made to accommodate pt scheduled coming in 9.6.13 @ 9am

## 2012-06-11 NOTE — Telephone Encounter (Signed)
Please schedule this patient an apt.      KP 

## 2012-06-11 NOTE — Telephone Encounter (Signed)
30 day supply sent.      KP 

## 2012-06-11 NOTE — Addendum Note (Signed)
Addended by: Arnette Norris on: 06/11/2012 10:40 AM   Modules accepted: Orders

## 2012-07-04 ENCOUNTER — Other Ambulatory Visit: Payer: Self-pay | Admitting: Internal Medicine

## 2012-07-05 ENCOUNTER — Ambulatory Visit (INDEPENDENT_AMBULATORY_CARE_PROVIDER_SITE_OTHER): Payer: PRIVATE HEALTH INSURANCE | Admitting: Internal Medicine

## 2012-07-05 VITALS — BP 144/92 | HR 55 | Temp 97.8°F | Wt 266.0 lb

## 2012-07-05 DIAGNOSIS — E785 Hyperlipidemia, unspecified: Secondary | ICD-10-CM | POA: Insufficient documentation

## 2012-07-05 DIAGNOSIS — I10 Essential (primary) hypertension: Secondary | ICD-10-CM

## 2012-07-05 DIAGNOSIS — E119 Type 2 diabetes mellitus without complications: Secondary | ICD-10-CM

## 2012-07-05 MED ORDER — METFORMIN HCL 850 MG PO TABS: 850.0000 mg | ORAL_TABLET | Freq: Three times a day (TID) | ORAL | Status: DC

## 2012-07-05 NOTE — Patient Instructions (Addendum)
Add Benicar 20/12.5 one tablet every day (if it works, he'll get a prescription) Check the  blood pressure 2 or 3 times a week, be sure it is between 110/60 and 140/85. If it is consistently higher or lower, let me know Stop Lipitor, see how you do with aches and pains Metformin 850 mg 2 tablets in the morning, one in the afternoon Exercise at least 3 hours a week Work in your diet ---- Schedule blood work, no need to be fasting, in 3 weeks: CMP-- dx  hypertension A1c--- dx  diabetes

## 2012-07-05 NOTE — Assessment & Plan Note (Signed)
Good compliance with Lipitor, he started on it ~ January 2013, since then is having aches and pains. Hold Lipitor for a month, then to start Pravachol 20 mg. Prescription provided.

## 2012-07-05 NOTE — Telephone Encounter (Signed)
Dr. Drue Novel has already refilled metformin & per Dr. Drue Novel pt is not to be taking Lipitor.

## 2012-07-05 NOTE — Assessment & Plan Note (Addendum)
Previous A1c was discussed with the patient, needs better control to prevent complications. Ambulatory blood sugars in the morning ~ 160, at mid day go up to  220 sometimes, in the afternoon drops to 100, 80.. We have extensive discussion about diet and exercise Currently on Glucophage 850 mg twice a day Plan: Increase Glucophage to 2 in the morning and one in the afternoon.

## 2012-07-05 NOTE — Progress Notes (Signed)
  Subjective:    Patient ID: Gabriel Patel, male    DOB: 1971-08-20, 41 y.o.   MRN: [id number]  HPI Routine office visit Diabetes, currently taking metformin twice a day Hypertension, good medication compliance with atenolol, ambulatory systolic BPs in the 130s, diastolic BPs range from 80-100. High cholesterol, good compliance with Lipitor, since he started Lipitor he is aching more than usual, mostly in the calves, also his hands feel tight.  Past Medical History: AODM Hypertension Anxiety GERD Palpaitations 07-2007: saw cards, ECHO normal   DUODENITIS W/HEMORRHAGE -- EGD was positive,H. pylori positive, status post treatment.  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 Diet about the same, has decrease portion size a little He is less physically active than before.     Objective:   Physical Exam  General -- alert, well-developed, and overweight appearing. No apparent distress.  Extremities-- no pretibial edema bilaterally ; hands and wrists without evidence of synovitis on exam Neurologic-- alert & oriented X3 and strength normal in all extremities. Psych-- Cognition and judgment appear intact. Alert and cooperative with normal attention span and concentration.  not anxious appearing and not depressed appearing.      Assessment & Plan:

## 2012-07-05 NOTE — Assessment & Plan Note (Addendum)
Ambulatory BPs elevated, diastolic BP occasionally 90 and even 100. In the past, high doses of atenolol making tire, took losartan for a while, he recalls having headaches Plan: Start a low dose of benicar HCT, 20/12.5, 28 samples provided. See instructions

## 2012-07-07 ENCOUNTER — Encounter: Payer: Self-pay | Admitting: Internal Medicine

## 2012-07-23 ENCOUNTER — Other Ambulatory Visit: Payer: Self-pay | Admitting: Internal Medicine

## 2012-07-30 ENCOUNTER — Other Ambulatory Visit (INDEPENDENT_AMBULATORY_CARE_PROVIDER_SITE_OTHER): Payer: PRIVATE HEALTH INSURANCE

## 2012-07-30 ENCOUNTER — Ambulatory Visit (INDEPENDENT_AMBULATORY_CARE_PROVIDER_SITE_OTHER): Payer: PRIVATE HEALTH INSURANCE

## 2012-07-30 DIAGNOSIS — Z23 Encounter for immunization: Secondary | ICD-10-CM

## 2012-07-30 DIAGNOSIS — E119 Type 2 diabetes mellitus without complications: Secondary | ICD-10-CM

## 2012-07-30 DIAGNOSIS — I10 Essential (primary) hypertension: Secondary | ICD-10-CM

## 2012-07-30 LAB — COMPREHENSIVE METABOLIC PANEL
ALT: 35 U/L (ref 0–53)
Alkaline Phosphatase: 71 U/L (ref 39–117)
Creatinine, Ser: 0.9 mg/dL (ref 0.4–1.5)
GFR: 99.73 mL/min (ref >60.00)
Sodium: 135 mEq/L (ref 135–145)
Total Bilirubin: 1 mg/dL (ref 0.3–1.2)
Total Protein: 7 g/dL (ref 6.0–8.3)

## 2012-07-30 LAB — HEMOGLOBIN A1C: Hgb A1c MFr Bld: 7.2 % — ABNORMAL HIGH (ref 4.6–6.5)

## 2012-08-02 ENCOUNTER — Other Ambulatory Visit: Payer: Self-pay | Admitting: Internal Medicine

## 2012-08-05 NOTE — Telephone Encounter (Signed)
Refill done.  

## 2012-08-06 ENCOUNTER — Telehealth: Payer: Self-pay

## 2012-08-06 NOTE — Telephone Encounter (Signed)
Mailed results waiting to hear back from pt.     MW

## 2012-08-06 NOTE — Telephone Encounter (Signed)
Left message for pt to call in concerning lab results.       MW

## 2012-08-07 ENCOUNTER — Telehealth: Payer: Self-pay

## 2012-08-07 NOTE — Telephone Encounter (Signed)
Spoke to pt concerning lab results advised pt: --Kidney and liver tests normal, he was a started on Benicar HCT, if his ambulatory BPs are okay, then call in a pescription. --As far as diabetes, A1c is still slightly high, we increased his medication 3 weeks ago, recommend to followup in December as recommended, and work on his diet and exercise.  Pt advised me he stopped taking Benicar HCT stating it slowed him down where it was hard to get out of bed. Pt stated understanding and results mailed.      MW

## 2012-10-08 ENCOUNTER — Ambulatory Visit: Payer: PRIVATE HEALTH INSURANCE | Admitting: Internal Medicine

## 2012-10-15 ENCOUNTER — Encounter: Payer: Self-pay | Admitting: Internal Medicine

## 2012-10-15 ENCOUNTER — Ambulatory Visit (INDEPENDENT_AMBULATORY_CARE_PROVIDER_SITE_OTHER): Payer: PRIVATE HEALTH INSURANCE | Admitting: Internal Medicine

## 2012-10-15 VITALS — BP 146/90 | HR 63 | Temp 98.1°F | Wt 267.0 lb

## 2012-10-15 DIAGNOSIS — E785 Hyperlipidemia, unspecified: Secondary | ICD-10-CM

## 2012-10-15 DIAGNOSIS — E119 Type 2 diabetes mellitus without complications: Secondary | ICD-10-CM

## 2012-10-15 DIAGNOSIS — I10 Essential (primary) hypertension: Secondary | ICD-10-CM

## 2012-10-15 NOTE — Assessment & Plan Note (Signed)
He is to Lipitor since her last office visit and felt great, no further aches and pains. Never started Pravachol. Plan: FLP, consider livalo- Pravachol.

## 2012-10-15 NOTE — Assessment & Plan Note (Signed)
Good compliance with metformin 2 in the morning and one in the afternoon, CBGs between 80 and 100. Plan: Check A1c, if not at goal  consider add victoza versus endocrine referral.

## 2012-10-15 NOTE — Progress Notes (Signed)
  Subjective:    Patient ID: Gabriel Patel, male    DOB: [birth year], 41 y.o.   MRN: [id number]  HPI Follow up DM-- good med compliance, amb CBGs ~80-100 HTN-- good medication compliance with atenolol except for the last 2 days; while on atenolol, BPs running 115, 125/80, 90. High cholesterol, he discontinue Lipitor, he feels great, no further aches or pains. Never started pravachol.  Past Medical History: AODM Hypertension Anxiety GERD Palpaitations 07-2007: saw cards, ECHO normal   DUODENITIS W/HEMORRHAGE -- EGD was positive,H. pylori positive, status post treatment.  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 Got his flu shot Unable to eat healthy or exercise much, works 12 hours a day plus.    Objective:   Physical Exam General -- alert, well-developed, and overweight appearing. No apparent distress.  Neurologic-- alert & oriented X3 and strength normal in all extremities. Psych-- Cognition and judgment appear intact. Alert and cooperative with normal attention span and concentration.  not anxious appearing and not depressed appearing.       Assessment & Plan:  Today , I spent more than 15  min with the patient, >50% of the time counseling about importance of diet and exercise, we review his labs together.

## 2012-10-15 NOTE — Patient Instructions (Addendum)
Come back fasting: FLP-- dx  high cholesterol A1c, microalbumin -- dx diabetes Next visit in 3 months

## 2012-10-15 NOTE — Assessment & Plan Note (Signed)
Ambulatory BPs while taking atenolol are very good. BP today elevated but hasn't taken atenolol in 2 days. Plan: Go back to atenolol, good compliance recommended

## 2012-10-16 ENCOUNTER — Other Ambulatory Visit (INDEPENDENT_AMBULATORY_CARE_PROVIDER_SITE_OTHER): Payer: PRIVATE HEALTH INSURANCE

## 2012-10-16 DIAGNOSIS — E119 Type 2 diabetes mellitus without complications: Secondary | ICD-10-CM

## 2012-10-16 DIAGNOSIS — E7800 Pure hypercholesterolemia, unspecified: Secondary | ICD-10-CM

## 2012-10-16 LAB — LIPID PANEL
HDL: 35.2 mg/dL — ABNORMAL LOW (ref >39.00)
Total CHOL/HDL Ratio: 5
Triglycerides: 132 mg/dL (ref 0.0–149.0)
VLDL: 26.4 mg/dL (ref 0.0–40.0)

## 2012-10-16 LAB — HEMOGLOBIN A1C: Hgb A1c MFr Bld: 7.3 % — ABNORMAL HIGH (ref 4.6–6.5)

## 2012-10-16 LAB — MICROALBUMIN / CREATININE URINE RATIO
Microalb Creat Ratio: 0.3 mg/g (ref 0.0–30.0)
Microalb, Ur: 0.4 mg/dL (ref 0.0–1.9)

## 2012-10-25 ENCOUNTER — Encounter: Payer: Self-pay | Admitting: Internal Medicine

## 2012-10-25 ENCOUNTER — Ambulatory Visit (INDEPENDENT_AMBULATORY_CARE_PROVIDER_SITE_OTHER): Payer: PRIVATE HEALTH INSURANCE | Admitting: Internal Medicine

## 2012-10-25 VITALS — BP 138/82 | HR 53 | Temp 98.2°F | Wt 265.0 lb

## 2012-10-25 DIAGNOSIS — E119 Type 2 diabetes mellitus without complications: Secondary | ICD-10-CM

## 2012-10-25 MED ORDER — SITAGLIPTIN PHOSPHATE 100 MG PO TABS: 100.0000 mg | ORAL_TABLET | Freq: Every day | ORAL | Status: DC

## 2012-10-25 NOTE — Progress Notes (Signed)
  Subjective:    Patient ID: Gabriel Patel, male    DOB: Jan 31, 1971, 41 y.o.   MRN: [id number]  HPI Here to discuss his latest A1c  Past Medical History  Diagnosis Date  . Hypertension   . Anxiety   . GERD (gastroesophageal reflux disease)   . Palpitations     07-2007: saw cards, ECHO normal    . Duodenitis     w/HEMORRHAGE, EGD was positive,H. pylori positive, status post treatment.  . Diabetes mellitus    Past Surgical History  Procedure Date  . No past surgeries      Review of Systems     Objective:   Physical Exam AOx3       Assessment & Plan:

## 2012-10-25 NOTE — Assessment & Plan Note (Signed)
Last  A1c discussed, goal is to be below 7. In addition to a better diet and more exercise I recommend additional medication. His best options are Januvia versus Victoza,  no family history of thyroid disease or thyroid cancer. Cost may be an issue consequently I gave him samples of Januvia, he will start taking it in the meantime he will find out what is the cost of meds. He likes the idea of victoza because  may promote weight loss.

## 2012-10-25 NOTE — Patient Instructions (Addendum)
Please see about the cost of Januvia versus Victoza and let me know In the meantime start Januvia one a day and continue metformin Next visit in 3 months

## 2013-02-23 ENCOUNTER — Other Ambulatory Visit: Payer: Self-pay | Admitting: Internal Medicine

## 2013-02-24 NOTE — Telephone Encounter (Signed)
Refill done.  

## 2013-03-30 ENCOUNTER — Other Ambulatory Visit: Payer: Self-pay | Admitting: Internal Medicine

## 2013-03-31 NOTE — Telephone Encounter (Signed)
Refill done.  

## 2013-06-10 ENCOUNTER — Other Ambulatory Visit: Payer: Self-pay | Admitting: Internal Medicine

## 2013-06-10 NOTE — Telephone Encounter (Signed)
Refill done for one month per protocol. Mailed letter reminding pt. Of need for OV.

## 2013-07-14 ENCOUNTER — Other Ambulatory Visit: Payer: Self-pay | Admitting: Internal Medicine

## 2013-07-15 NOTE — Telephone Encounter (Signed)
rx refilled per protocol. DJR  

## 2013-08-05 ENCOUNTER — Encounter: Payer: Self-pay | Admitting: Internal Medicine

## 2013-08-05 ENCOUNTER — Ambulatory Visit (INDEPENDENT_AMBULATORY_CARE_PROVIDER_SITE_OTHER): Payer: BC Managed Care – PPO | Admitting: Internal Medicine

## 2013-08-05 ENCOUNTER — Other Ambulatory Visit: Payer: Self-pay | Admitting: Internal Medicine

## 2013-08-05 VITALS — BP 140/101 | HR 60 | Temp 98.6°F | Wt 267.2 lb

## 2013-08-05 DIAGNOSIS — G5600 Carpal tunnel syndrome, unspecified upper limb: Secondary | ICD-10-CM

## 2013-08-05 DIAGNOSIS — E119 Type 2 diabetes mellitus without complications: Secondary | ICD-10-CM

## 2013-08-05 DIAGNOSIS — Z23 Encounter for immunization: Secondary | ICD-10-CM

## 2013-08-05 DIAGNOSIS — R5381 Other malaise: Secondary | ICD-10-CM | POA: Insufficient documentation

## 2013-08-05 DIAGNOSIS — R5383 Other fatigue: Secondary | ICD-10-CM

## 2013-08-05 DIAGNOSIS — K219 Gastro-esophageal reflux disease without esophagitis: Secondary | ICD-10-CM

## 2013-08-05 DIAGNOSIS — I10 Essential (primary) hypertension: Secondary | ICD-10-CM

## 2013-08-05 LAB — CBC WITH DIFFERENTIAL/PLATELET
Basophils Absolute: 0 10*3/uL (ref 0.0–0.1)
Basophils Relative: 0.3 % (ref 0.0–3.0)
Eosinophils Absolute: 0.2 10*3/uL (ref 0.0–0.7)
Eosinophils Relative: 1.4 % (ref 0.0–5.0)
HCT: 47.6 % (ref 39.0–52.0)
Hemoglobin: 15.8 g/dL (ref 13.0–17.0)
Lymphocytes Relative: 22.2 % (ref 12.0–46.0)
Lymphs Abs: 2.5 10*3/uL (ref 0.7–4.0)
MCHC: 33.1 g/dL (ref 30.0–36.0)
MCV: 88.6 fl (ref 78.0–100.0)
Neutro Abs: 7.7 10*3/uL (ref 1.4–7.7)
RBC: 5.37 Mil/uL (ref 4.22–5.81)
RDW: 13.3 % (ref 11.5–14.6)

## 2013-08-05 LAB — COMPREHENSIVE METABOLIC PANEL
Alkaline Phosphatase: 66 U/L (ref 39–117)
CO2: 27 mEq/L (ref 19–32)
Creatinine, Ser: 1 mg/dL (ref 0.4–1.5)
GFR: 87.77 mL/min (ref >60.00)
Glucose, Bld: 109 mg/dL — ABNORMAL HIGH (ref 70–99)
Total Bilirubin: 1 mg/dL (ref 0.3–1.2)

## 2013-08-05 LAB — TSH: TSH: 0.31 u[IU]/mL — ABNORMAL LOW (ref 0.35–5.50)

## 2013-08-05 MED ORDER — INSULIN PEN NEEDLE 32G X 4 MM MISC: Status: DC

## 2013-08-05 MED ORDER — LIRAGLUTIDE 18 MG/3ML ~~LOC~~ SOPN: 1.2000 mg | PEN_INJECTOR | Freq: Every day | SUBCUTANEOUS | Status: DC

## 2013-08-05 NOTE — Assessment & Plan Note (Signed)
Will do general labs, if labs normal and  blood sugar well-controlled will consider a sleep study, he has several RF for sleep apnea. Also stress may be playing a role.

## 2013-08-05 NOTE — Patient Instructions (Signed)
Get your blood work before you leave  Next visit in 6 weeks  for a check up  Stop Januvia Start Victoza 0.6 mg every day for one week, then 1.2 milligrams daily. Check your blood sugars daily, call anytime if > 200  Check the  blood pressure 2 or 3 times a week, be sure it is between 110/60 and 140/85. If it is consistently higher or lower, let me know

## 2013-08-05 NOTE — Progress Notes (Signed)
  Subjective:    Patient ID: Gabriel Patel, male    DOB: 1971-06-16, 42 y.o.   MRN: [id number]  HPI Acute visit, has several issues to discuss Feeling very fatigued in the afternoons, lack of energy and falling asleep very easily. Diabetes--good compliance with Januvia, has not take his blood sugars but likes to switch to victoza Hypertension, good medication compliance except this AM did not take his medicines. No ambulatory BPs. Long history of carpal tunnel syndrome, getting worse, reports that both hand feel swollen (but they are not swollen) and numbness if he tries to write.  Past Medical History  Diagnosis Date  . Hypertension   . Anxiety   . GERD (gastroesophageal reflux disease)   . Palpitations     07-2007: saw cards, ECHO normal    . Duodenitis     w/HEMORRHAGE, EGD was positive,H. pylori positive, status post treatment.  . Diabetes mellitus    Past Surgical History  Procedure Laterality Date  . No past surgeries     History   Social History  . Marital Status: Married    Spouse Name: N/A    Number of Children: 1  . Years of Education: N/A   Occupational History  . 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 Smoking status: Former Games developer  . Smokeless tobacco: Never Used     Comment:  occ has a cigar  . Alcohol Use: Yes     Comment: socially   . Drug Use: No  . Sexual Activity: Not on file   Other Topics Concern  . Not on file   Social History Narrative   Married -- separated , lives w/  g-friend       Review of Systems Admits to a lot of stress at work, has been working outside of town for months. GERD symptoms well controlled with Nexium No nausea, vomiting, diarrhea blood in the stools. Denies any blurred vision,no  increased thirst or urination Occasional neck pain only if he has to work the counter at work and answer the phone (lots of neck tension), no radiation.    Objective:   Physical Exam BP  140/101  Pulse 60  Temp(Src) 98.6 F (37 C)  Wt 267 lb 3.2 oz (121.201 kg)  BMI 36.23 kg/m2  SpO2 94% General -- alert, well-developed, NAD.  Neck --FROM Lungs -- normal respiratory effort, no intercostal retractions, no accessory muscle use, and normal breath sounds.  Heart-- normal rate, regular rhythm, no murmur.   Extremities-- no pretibial edema bilaterally ; Hands and wrists without synovitis, no edema. Neurologic--  alert & oriented X3. Speech normal, gait normal, strength normal in all extremities.  DTRs symmetric , Slightly decreased throughout Psych-- Cognition and judgment appear intact. Cooperative with normal attention span and concentration. No anxious appearing , no depressed appearing.      Assessment & Plan:

## 2013-08-05 NOTE — Assessment & Plan Note (Addendum)
Good medication compliance, not ambulatory BPs, BP is elevated today. Plan: No change, self monitoring, reassess on RTC

## 2013-08-05 NOTE — Assessment & Plan Note (Addendum)
Worse, rec a referal, pt some how reluctant b/c saw ortho before; i explained potential for permanent nerve damage so he agreed to be referred. likes to see a different surgeon: refer to [facility]

## 2013-08-05 NOTE — Assessment & Plan Note (Signed)
Well-controlled with OTC Nexium

## 2013-08-05 NOTE — Assessment & Plan Note (Addendum)
Good compliance with metformin and Januvia, likes to try Victoza due to it  weight loss side effects. Diet and exercise discussed. Labs Switch to victoza

## 2013-08-11 ENCOUNTER — Other Ambulatory Visit: Payer: Self-pay | Admitting: Internal Medicine

## 2013-08-11 NOTE — Telephone Encounter (Signed)
rx refilled per protocol. DJR  

## 2013-08-12 ENCOUNTER — Other Ambulatory Visit (INDEPENDENT_AMBULATORY_CARE_PROVIDER_SITE_OTHER): Payer: BC Managed Care – PPO

## 2013-08-12 DIAGNOSIS — R7989 Other specified abnormal findings of blood chemistry: Secondary | ICD-10-CM

## 2013-08-12 DIAGNOSIS — R6889 Other general symptoms and signs: Secondary | ICD-10-CM

## 2013-08-12 LAB — T4, FREE: Free T4: 0.93 ng/dL (ref 0.60–1.60)

## 2013-08-15 ENCOUNTER — Telehealth: Payer: Self-pay | Admitting: *Deleted

## 2013-08-15 MED ORDER — LOSARTAN POTASSIUM 25 MG PO TABS: 25.0000 mg | ORAL_TABLET | Freq: Every day | ORAL | Status: AC

## 2013-08-15 NOTE — Telephone Encounter (Signed)
(  previously on benicar HCT, BP was very low) Plan:  Advise patient to start a very low dose of losartan 25 mg 1 po qd, Rx sent  Because this medications, he needs to have blood work in 2 weeks from today: Arrange BMP-- dx hypertension Call w/ BP readings next week

## 2013-08-15 NOTE — Telephone Encounter (Signed)
Left message for patient to return call.

## 2013-08-15 NOTE — Telephone Encounter (Signed)
[facility] called and stated [facility] and when they took his blood pressure, it read 150/112. Please advise

## 2013-08-20 NOTE — Telephone Encounter (Signed)
[facility] from [facility] reviewed, was seen with ear infection. Plan: 1.Call patient again, discuss BP medication 2. Had a ear infection, if he is not improving needs to be seen

## 2013-09-08 ENCOUNTER — Other Ambulatory Visit: Payer: Self-pay | Admitting: Internal Medicine

## 2013-09-08 NOTE — Telephone Encounter (Signed)
Atenolol refill sent to pharmacy 

## 2013-09-16 ENCOUNTER — Ambulatory Visit: Payer: BC Managed Care – PPO | Admitting: Internal Medicine

## 2013-09-16 DIAGNOSIS — Z0289 Encounter for other administrative examinations: Secondary | ICD-10-CM

## 2013-10-09 ENCOUNTER — Other Ambulatory Visit: Payer: Self-pay | Admitting: Internal Medicine

## 2013-10-09 NOTE — Telephone Encounter (Signed)
rx refilled per protocol. DJR  

## 2013-12-10 ENCOUNTER — Other Ambulatory Visit: Payer: Self-pay | Admitting: Internal Medicine

## 2013-12-17 ENCOUNTER — Other Ambulatory Visit: Payer: Self-pay | Admitting: Internal Medicine

## 2014-03-10 ENCOUNTER — Other Ambulatory Visit: Payer: Self-pay | Admitting: Internal Medicine

## 2014-03-11 MED ORDER — METFORMIN HCL 850 MG PO TABS: ORAL_TABLET | ORAL | Status: DC

## 2014-04-08 ENCOUNTER — Other Ambulatory Visit: Payer: Self-pay | Admitting: Internal Medicine

## 2014-05-26 ENCOUNTER — Telehealth: Payer: Self-pay

## 2014-05-26 NOTE — Telephone Encounter (Signed)
Spoke with patient..he has a new PCP outside of [place].

## 2014-06-01 ENCOUNTER — Other Ambulatory Visit: Payer: Self-pay | Admitting: Internal Medicine

## 2014-06-30 ENCOUNTER — Other Ambulatory Visit: Payer: Self-pay | Admitting: Nurse Practitioner

## 2014-06-30 ENCOUNTER — Ambulatory Visit
Admission: RE | Admit: 2014-06-30 | Discharge: 2014-06-30 | Disposition: A | Discharge status: Home / Self Care | Payer: BC Managed Care – PPO | Source: Ambulatory Visit | Attending: Nurse Practitioner | Admitting: Nurse Practitioner

## 2014-06-30 DIAGNOSIS — R059 Cough, unspecified: Secondary | ICD-10-CM

## 2014-06-30 DIAGNOSIS — R05 Cough: Secondary | ICD-10-CM

## 2014-07-02 ENCOUNTER — Other Ambulatory Visit: Payer: Self-pay | Admitting: Internal Medicine

## 2014-08-14 ENCOUNTER — Other Ambulatory Visit: Payer: Self-pay

## 2014-08-14 MED ORDER — METFORMIN HCL 850 MG PO TABS: ORAL_TABLET | ORAL | Status: AC

## 2014-11-09 ENCOUNTER — Emergency Department (INDEPENDENT_AMBULATORY_CARE_PROVIDER_SITE_OTHER)
Admission: EM | Admit: 2014-11-09 | Discharge: 2014-11-09 | Disposition: A | Discharge status: Home / Self Care | Payer: BLUE CROSS/BLUE SHIELD | Source: Home / Self Care | Attending: Family Medicine | Admitting: Family Medicine

## 2014-11-09 ENCOUNTER — Encounter (HOSPITAL_COMMUNITY): Payer: Self-pay | Admitting: *Deleted

## 2014-11-09 DIAGNOSIS — R1013 Epigastric pain: Secondary | ICD-10-CM

## 2014-11-09 DIAGNOSIS — R1031 Right lower quadrant pain: Secondary | ICD-10-CM

## 2014-11-09 DIAGNOSIS — R42 Dizziness and giddiness: Secondary | ICD-10-CM

## 2014-11-09 DIAGNOSIS — H8390 Unspecified disease of inner ear, unspecified ear: Secondary | ICD-10-CM

## 2014-11-09 MED ORDER — MECLIZINE HCL 25 MG PO TABS: 25.0000 mg | ORAL_TABLET | Freq: Three times a day (TID) | ORAL | Status: AC | PRN

## 2014-11-09 MED ORDER — ONDANSETRON HCL 4 MG PO TABS: 4.0000 mg | ORAL_TABLET | Freq: Four times a day (QID) | ORAL | Status: AC

## 2014-11-09 NOTE — ED Notes (Signed)
Pt  Reports  Low  abd   Pain      Earlier -     Dizzy  Pt is  A  Diabetic  Who  Was  returned  From a  Cruise  2  Days  Ago      -  Pt  denys  Any  Pain  At this  Time  But     Has  Been  Constipated     -  He  States  He  Checked  His  Sugar  This  Am  And  It  Was  7261       -

## 2014-11-09 NOTE — Discharge Instructions (Signed)
Abdominal Pain Many things can cause abdominal pain. Usually, abdominal pain is not caused by a disease and will improve without treatment. It can often be observed and treated at home. Your health care provider will do a physical exam and possibly order blood tests and X-rays to help determine the seriousness of your pain. However, in many cases, more time must pass before a clear cause of the pain can be found. Before that point, your health care provider may not know if you need more testing or further treatment. HOME CARE INSTRUCTIONS  Monitor your abdominal pain for any changes. The following actions may help to alleviate any discomfort you are experiencing:  Only take over-the-counter or prescription medicines as directed by your health care provider.  Do not take laxatives unless directed to do so by your health care provider.  Try a clear liquid diet (broth, tea, or water) as directed by your health care provider. Slowly move to a bland diet as tolerated. SEEK MEDICAL CARE IF:  You have unexplained abdominal pain.  You have abdominal pain associated with nausea or diarrhea.  You have pain when you urinate or have a bowel movement.  You experience abdominal pain that wakes you in the night.  You have abdominal pain that is worsened or improved by eating food.  You have abdominal pain that is worsened with eating fatty foods.  You have a fever. SEEK IMMEDIATE MEDICAL CARE IF:   Your pain does not go away within 2 hours.  You keep throwing up (vomiting).  Your pain is felt only in portions of the abdomen, such as the right side or the left lower portion of the abdomen.  You pass bloody or black tarry stools. MAKE SURE YOU:  Understand these instructions.   Will watch your condition.   Will get help right away if you are not doing well or get worse.  Document Released: 07/26/2005 Document Revised: 10/21/2013 Document Reviewed: 06/25/2013 [facility] Patient Information  2015 [place], [place]. This information is not intended to replace advice given to you by your health care provider. Make sure you discuss any questions you have with your health care provider.  Dizziness Dizziness is a common problem. It is a feeling of unsteadiness or light-headedness. You may feel like you are about to faint. Dizziness can lead to injury if you stumble or fall. A person of any age group can suffer from dizziness, but dizziness is more common in older adults. CAUSES  Dizziness can be caused by many different things, including:  Middle ear problems.  Standing for too long.  Infections.  An allergic reaction.  Aging.  An emotional response to something, such as the sight of blood.  Side effects of medicines.  Tiredness.  Problems with circulation or blood pressure.  Excessive use of alcohol or medicines, or illegal drug use.  Breathing too fast (hyperventilation).  An irregular heart rhythm (arrhythmia).  A low red blood cell count (anemia).  Pregnancy.  Vomiting, diarrhea, fever, or other illnesses that cause body fluid loss (dehydration).  Diseases or conditions such as Parkinson's disease, high blood pressure (hypertension), diabetes, and thyroid problems.  Exposure to extreme heat. DIAGNOSIS  Your health care provider will ask about your symptoms, perform a physical exam, and perform an electrocardiogram (ECG) to record the electrical activity of your heart. Your health care provider may also perform other heart or blood tests to determine the cause of your dizziness. These may include:  Transthoracic echocardiogram (TTE). During echocardiography, sound waves  are used to evaluate how blood flows through your heart.  Transesophageal echocardiogram (TEE).  Cardiac monitoring. This allows your health care provider to monitor your heart rate and rhythm in real time.  Holter monitor. This is a portable device that records your heartbeat and can help  diagnose heart arrhythmias. It allows your health care provider to track your heart activity for several days if needed.  Stress tests by exercise or by giving medicine that makes the heart beat faster. TREATMENT  Treatment of dizziness depends on the cause of your symptoms and can vary greatly. HOME CARE INSTRUCTIONS   Drink enough fluids to keep your urine clear or pale yellow. This is especially important in very hot weather. In older adults, it is also important in cold weather.  Take your medicine exactly as directed if your dizziness is caused by medicines. When taking blood pressure medicines, it is especially important to get up slowly.  Rise slowly from chairs and steady yourself until you feel okay.  In the morning, first sit up on the side of the bed. When you feel okay, stand slowly while holding onto something until you know your balance is fine.  Move your legs often if you need to stand in one place for a long time. Tighten and relax your muscles in your legs while standing.  Have someone stay with you for 1-2 days if dizziness continues to be a problem. Do this until you feel you are well enough to stay alone. Have the person call your health care provider if he or she notices changes in you that are concerning.  Do not drive or use heavy machinery if you feel dizzy.  Do not drink alcohol. SEEK IMMEDIATE MEDICAL CARE IF:   Your dizziness or light-headedness gets worse.  You feel nauseous or vomit.  You have problems talking, walking, or using your arms, hands, or legs.  You feel weak.  You are not thinking clearly or you have trouble forming sentences. It may take a friend or family member to notice this.  You have chest pain, abdominal pain, shortness of breath, or sweating.  Your vision changes.  You notice any bleeding.  You have side effects from medicine that seems to be getting worse rather than better. MAKE SURE YOU:   Understand these  instructions.  Will watch your condition.  Will get help right away if you are not doing well or get worse. Document Released: 04/11/2001 Document Revised: 10/21/2013 Document Reviewed: 05/05/2011 [facility] Patient Information 2015 [place]. This information is not intended to replace advice given to you by your health care provider. Make sure you discuss any questions you have with your health care provider.

## 2014-11-09 NOTE — ED Provider Notes (Signed)
CSN: [id number]     Arrival date & time 11/09/14  1401 History   First MD Initiated Contact with Patient 11/09/14 1435     Chief Complaint  Patient presents with  . Abdominal Pain   (Consider location/radiation/quality/duration/timing/severity/associated sxs/prior Treatment) HPI Comments: 44 year old male is accompanied by his wife. 8-9 days ago the patient and his wife went on a cruise. During this time he was constipated for 3-4 days. He experienced epigastric pain that was moderate to severe but this was later followed by pain across the right mid abdomen. The following day he developed fever, diaphoresis and dizziness. The day after the patient had consumed a few alcohol drinks he developed a severe headache that lasted for 3 days. It finally abated. Since being home from the trip the patient has no more abdominal pain, fever or constipation. His only complaint is that of dizziness and tinnitus.   Patient is a 44 y.o. male presenting with abdominal pain.  Abdominal Pain Associated symptoms: nausea   Associated symptoms: no chest pain, no cough, no diarrhea, no fever, no shortness of breath, no sore throat and no vomiting     Past Medical History  Diagnosis Date  . Hypertension   . Anxiety   . GERD (gastroesophageal reflux disease)   . Palpitations     07-2007: saw cards, ECHO normal    . Duodenitis     w/HEMORRHAGE, EGD was positive,H. pylori positive, status post treatment.  . Diabetes mellitus    Past Surgical History  Procedure Laterality Date  . No past surgeries     Family History  Problem Relation Age of Onset  . Coronary artery disease Neg Hx   . Diabetes Other     UNCLE   History  Substance Use Topics  . Smoking status: Former Games developermoker  . Smokeless tobacco: Never Used     Comment:  occ has a cigar  . Alcohol Use: Yes     Comment: socially     Review of Systems  Constitutional: Positive for activity change and appetite change. Negative for fever.  HENT:  Positive for tinnitus. Negative for congestion, ear discharge, ear pain, postnasal drip, rhinorrhea, sore throat and trouble swallowing.        Tinnitus  Eyes: Negative.   Respiratory: Negative for cough, chest tightness and shortness of breath.   Cardiovascular: Negative for chest pain and leg swelling.  Gastrointestinal: Positive for nausea. Negative for vomiting, abdominal pain, diarrhea and rectal pain.  Genitourinary: Negative.   Skin: Negative.   Neurological: Positive for dizziness. Negative for syncope, speech difficulty and headaches.  Psychiatric/Behavioral: Negative.     Allergies  Latex  Home Medications   Prior to Admission medications   Medication Sig Start Date End Date Taking? Authorizing Provider  atenolol (TENORMIN) 100 MG tablet TAKE 1 TABLET BY MOUTH TWICE A DAY 12/17/13   Wanda PlumpJose E Paz, MD  esomeprazole (NEXIUM) 20 MG capsule Take 20 mg by mouth daily before breakfast.    Historical Provider, MD  losartan (COZAAR) 25 MG tablet Take 1 tablet (25 mg total) by mouth daily. 08/15/13   Wanda PlumpJose E Paz, MD  meclizine (ANTIVERT) 25 MG tablet Take 1 tablet (25 mg total) by mouth 3 (three) times daily as needed for dizziness. 11/09/14   Hayden Rasmussenavid Hemi Chacko, NP  metFORMIN (GLUCOPHAGE) 850 MG tablet TAKE 1 TABLET BY MOUTH 3 TIMES A DAY. OVERDUE FOR APPT WITH DR PAZ. NO FURTHER REFILLS UNTIL SEEN. [phone]. 08/14/14   Wanda PlumpJose E Paz, MD  ondansetron (ZOFRAN) 4 MG tablet Take 1 tablet (4 mg total) by mouth every 6 (six) hours. As needed for nausea 11/09/14   Hayden Rasmussen, NP   BP 132/83 mmHg  Pulse 65  Temp(Src) 98.5 F (36.9 C) (Oral)  Resp 16  SpO2 98% Physical Exam  Constitutional: He is oriented to person, place, and time. He appears well-developed and well-nourished. No distress.  HENT:  Mouth/Throat: Oropharynx is clear and moist. No oropharyngeal exudate.  Bilateral TMs mildly retracted Saw palmetto rises symmetrically  Eyes: Conjunctivae and EOM are normal. Pupils are equal, round, and  reactive to light.  Neck: Normal range of motion. Neck supple.  Cardiovascular: Normal rate, regular rhythm, normal heart sounds and intact distal pulses.   No murmur heard. Pulmonary/Chest: Effort normal and breath sounds normal. No respiratory distress. He has no wheezes. He has no rales.  Abdominal: Soft. He exhibits no distension and no mass. There is no rebound and no guarding.  Deep palpation to the right mid and lower abdomen produces discomfort to the midline of the abdomen just below the umbilicus. No direct tenderness over the right lower quadrant.  Musculoskeletal: He exhibits no edema.  Lymphadenopathy:    He has no cervical adenopathy.  Neurological: He is alert and oriented to person, place, and time. He exhibits normal muscle tone.  Skin: Skin is warm and dry. No rash noted.  Psychiatric: He has a normal mood and affect.  Nursing note and vitals reviewed.   ED Course  Procedures (including critical care time) Labs Review Labs Reviewed - No data to display  Imaging Review No results found.   MDM   1. Disorder of inner ear, unspecified laterality   2. Right lower quadrant abdominal pain   3. Epigastric pain   4. Dizziness    There are many variables that may be accounting for the patient's symptoms. He was on a cruise ship and developed dizziness. He was unable to check his blood sugars as he forgot his glucometer. He had a night of drinking followed by headache and dizziness. This may have been caused by the alcohol consumption and accommodation of blood sugar changes and dehydration. The abdominal pain may have been associated with his history of reflux, gastritis and constipation. The symptoms of dizziness particularly with head movement and body position changes and tinnitus likely due to an inner ear disorder. Overall most of the symptoms have abated other than the dizziness and tinnitus. Meclizine as needed for dizziness Zofran as needed for nausea 4 future  episodes of abdominal pain, sweating, vomiting, fever or other problems go to the emergency department promptly. No alcohol or sick foods. Continue taking your Nexium. Check her blood sugars regularly and up to 3 times a day for at least 3 days to have measurements and different times during the day that she can show your doctor.    Hayden Rasmussen, NP 11/09/14 1620

## 2015-01-13 IMAGING — CR DG CHEST 2V
2 series · 2 of 2 positions shown · non-contrast
Comparison: None.

CLINICAL DATA: Cough for 2 weeks, fatigue, shortness of breath,
smoking history

EXAM:
CHEST  2 VIEW

[w chest pa]
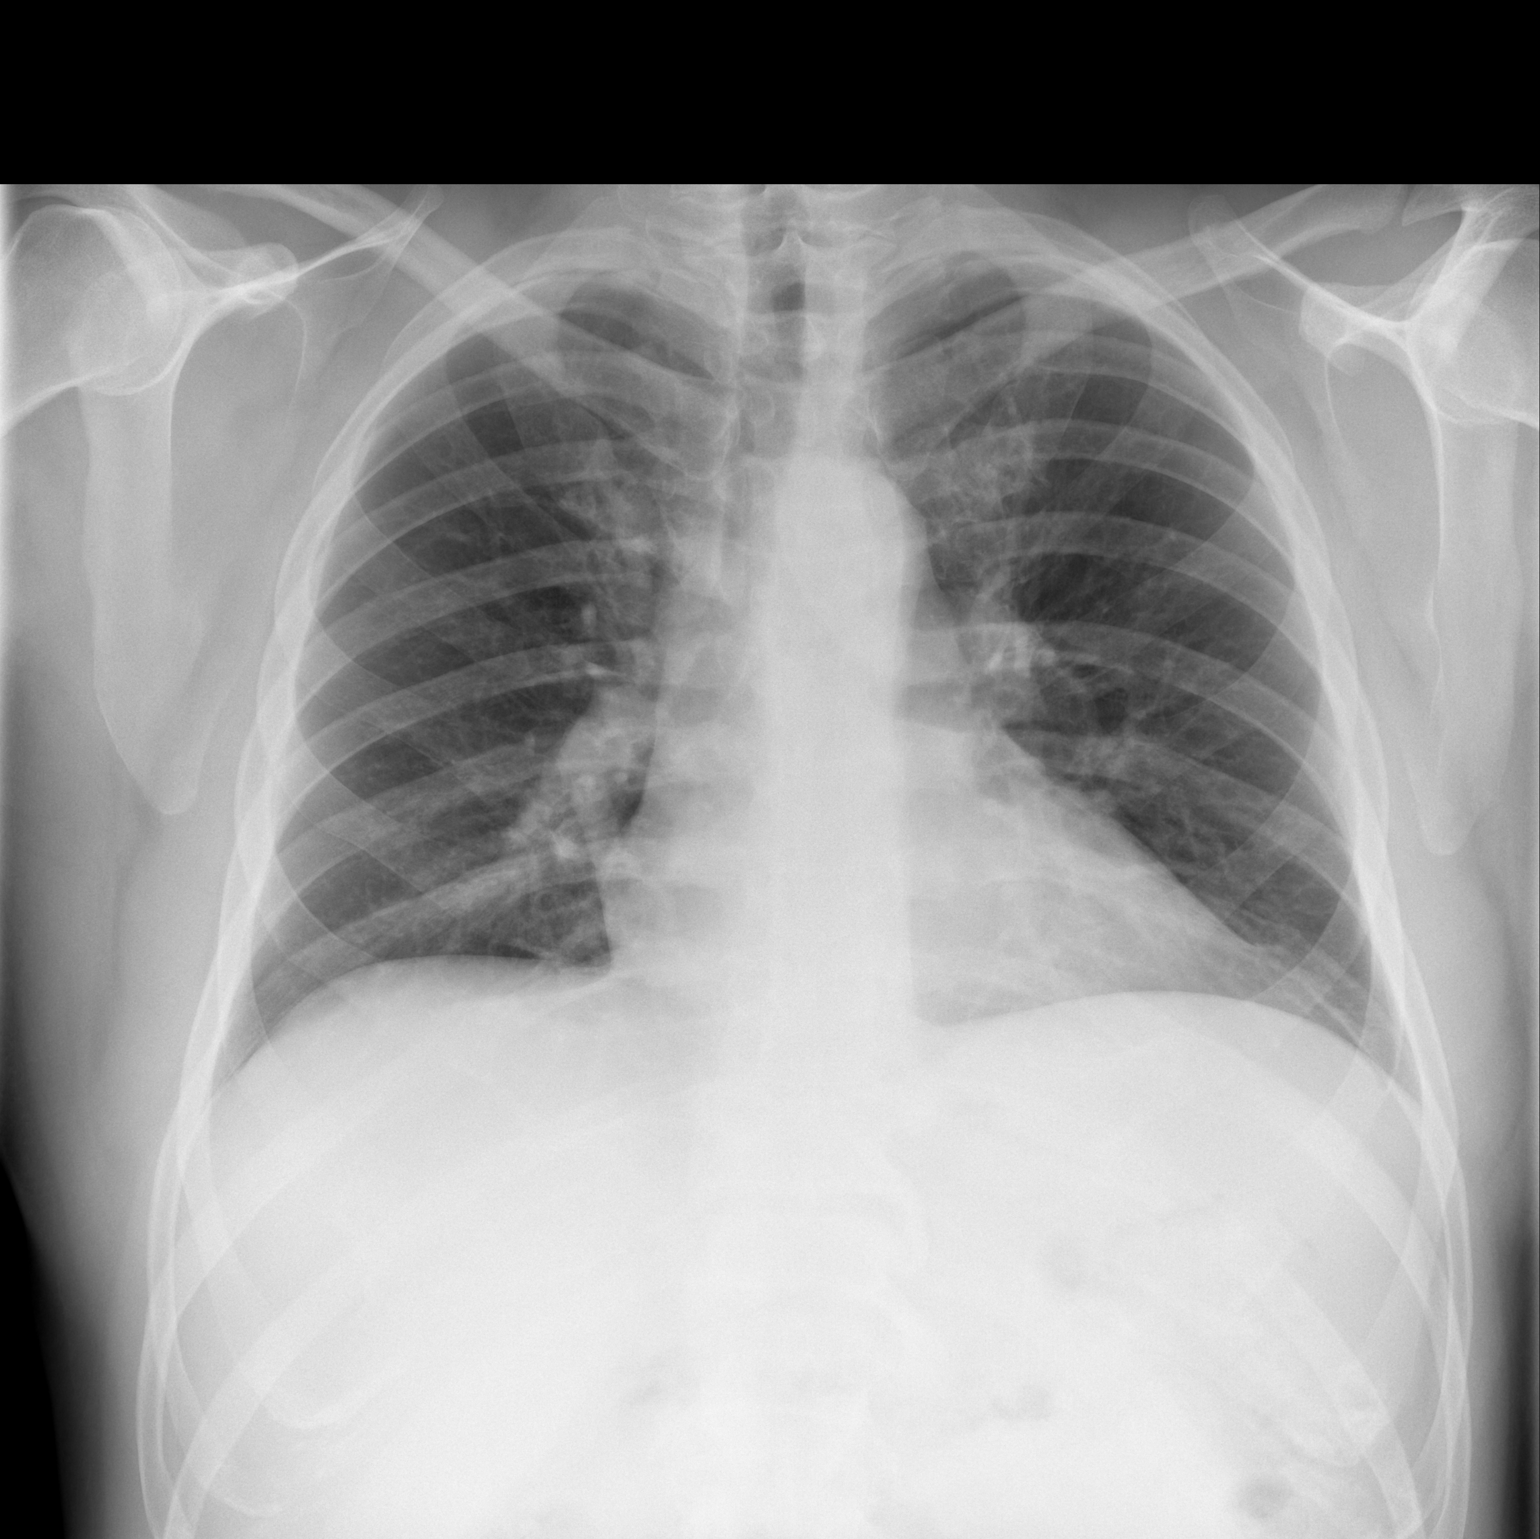

[w chest lat]
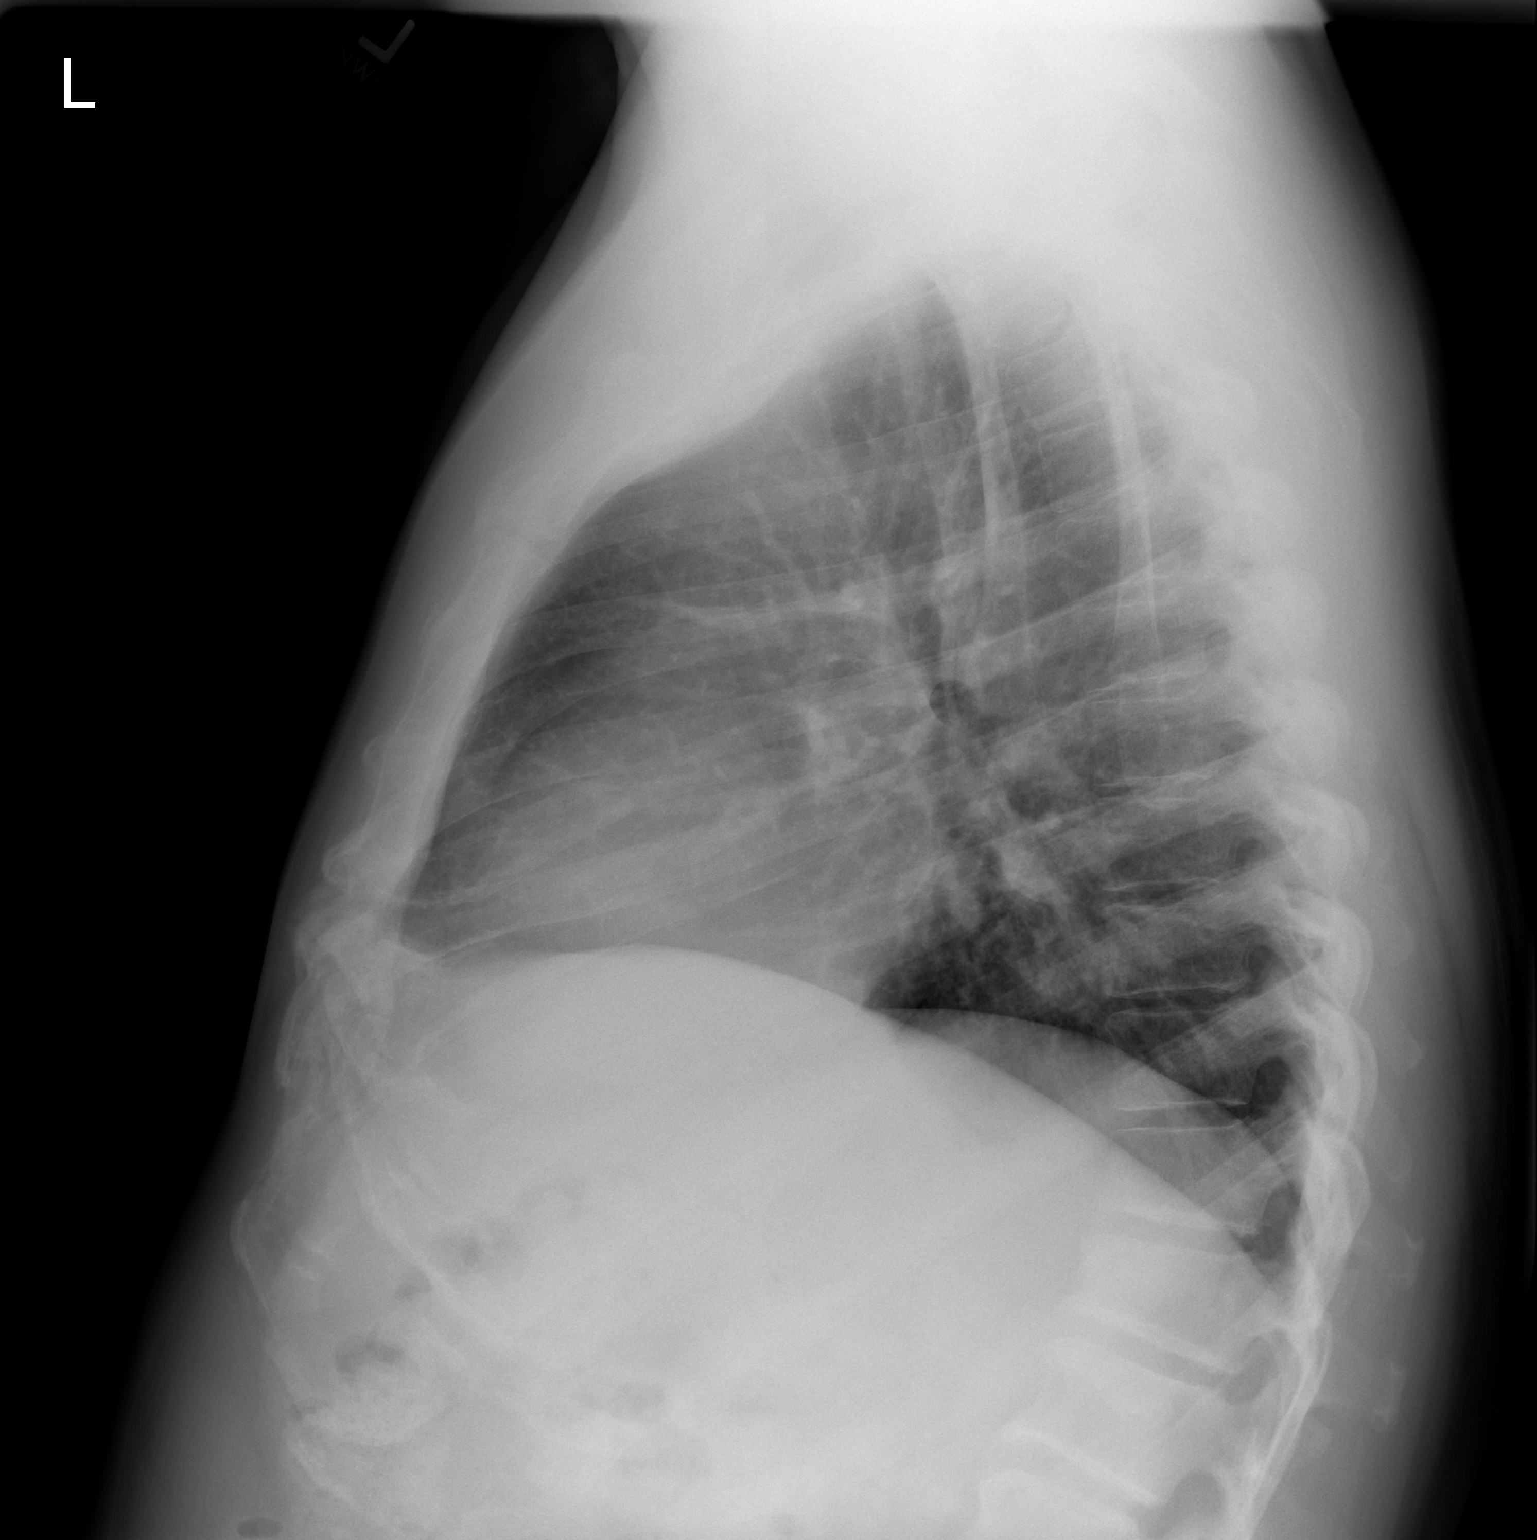

[2 of 2 positions shown; findings below may reference images not displayed]

FINDINGS: No active infiltrate or effusion. Mild linear scarring is noted at
the right lung base. Mediastinal and hilar contours are
unremarkable. The heart is within upper limits of normal. No acute
bony abnormality seen with mild degenerative change in the lower
thoracic and upper lumbar spine.
IMPRESSION: No active cardiopulmonary disease.
